# Patient Record
Sex: Female | Born: 1975 | Race: White | Hispanic: No | Marital: Married | Smoking: Never smoker
Health system: Southern US, Community
[De-identification: ages and names within clinical notes are randomized; demographics above are authoritative.]

## PROBLEM LIST (undated history)

## (undated) ENCOUNTER — Emergency Department (HOSPITAL_BASED_OUTPATIENT_CLINIC_OR_DEPARTMENT_OTHER): Payer: BLUE CROSS/BLUE SHIELD

## (undated) DIAGNOSIS — N3010 Interstitial cystitis (chronic) without hematuria: Secondary | ICD-10-CM

## (undated) HISTORY — DX: Interstitial cystitis (chronic) without hematuria: N30.10

## (undated) HISTORY — PX: AUGMENTATION MAMMAPLASTY: SUR837

---

## 1999-12-10 ENCOUNTER — Other Ambulatory Visit: Admission: RE | Admit: 1999-12-10 | Discharge: 1999-12-10 | Payer: Self-pay | Admitting: Obstetrics and Gynecology

## 2000-07-13 ENCOUNTER — Other Ambulatory Visit: Admission: RE | Admit: 2000-07-13 | Discharge: 2000-07-13 | Payer: Self-pay | Admitting: Obstetrics and Gynecology

## 2000-12-20 ENCOUNTER — Encounter: Payer: Self-pay | Admitting: Obstetrics & Gynecology

## 2000-12-20 ENCOUNTER — Inpatient Hospital Stay (HOSPITAL_COMMUNITY): Admission: AD | Admit: 2000-12-20 | Discharge: 2000-12-22 | Payer: Self-pay | Admitting: Obstetrics and Gynecology

## 2000-12-27 ENCOUNTER — Inpatient Hospital Stay (HOSPITAL_COMMUNITY): Admission: AD | Admit: 2000-12-27 | Discharge: 2001-01-16 | Payer: Self-pay | Admitting: Obstetrics and Gynecology

## 2000-12-27 ENCOUNTER — Encounter (INDEPENDENT_AMBULATORY_CARE_PROVIDER_SITE_OTHER): Payer: Self-pay | Admitting: Specialist

## 2001-01-03 ENCOUNTER — Encounter: Payer: Self-pay | Admitting: Obstetrics & Gynecology

## 2001-01-05 ENCOUNTER — Encounter: Payer: Self-pay | Admitting: Obstetrics and Gynecology

## 2001-01-09 ENCOUNTER — Encounter: Payer: Self-pay | Admitting: *Deleted

## 2001-01-11 ENCOUNTER — Encounter: Payer: Self-pay | Admitting: Obstetrics and Gynecology

## 2001-01-17 ENCOUNTER — Encounter: Admission: RE | Admit: 2001-01-17 | Discharge: 2001-02-16 | Payer: Self-pay | Admitting: Obstetrics and Gynecology

## 2001-02-10 ENCOUNTER — Other Ambulatory Visit: Admission: RE | Admit: 2001-02-10 | Discharge: 2001-02-10 | Payer: Self-pay | Admitting: Obstetrics and Gynecology

## 2001-02-19 ENCOUNTER — Ambulatory Visit (HOSPITAL_COMMUNITY): Admission: RE | Admit: 2001-02-19 | Discharge: 2001-02-19 | Payer: Self-pay | Admitting: Obstetrics and Gynecology

## 2001-02-19 ENCOUNTER — Encounter (INDEPENDENT_AMBULATORY_CARE_PROVIDER_SITE_OTHER): Payer: Self-pay

## 2001-04-20 ENCOUNTER — Encounter: Payer: Self-pay | Admitting: Obstetrics and Gynecology

## 2001-04-20 ENCOUNTER — Encounter: Admission: RE | Admit: 2001-04-20 | Discharge: 2001-04-20 | Payer: Self-pay | Admitting: Obstetrics and Gynecology

## 2001-08-08 ENCOUNTER — Encounter: Payer: Self-pay | Admitting: Obstetrics and Gynecology

## 2001-08-08 ENCOUNTER — Encounter: Admission: RE | Admit: 2001-08-08 | Discharge: 2001-08-08 | Payer: Self-pay | Admitting: Obstetrics and Gynecology

## 2002-11-08 ENCOUNTER — Other Ambulatory Visit: Admission: RE | Admit: 2002-11-08 | Discharge: 2002-11-08 | Payer: Self-pay | Admitting: Obstetrics and Gynecology

## 2003-06-04 ENCOUNTER — Inpatient Hospital Stay (HOSPITAL_COMMUNITY): Admission: AD | Admit: 2003-06-04 | Discharge: 2003-06-08 | Payer: Self-pay | Admitting: Obstetrics and Gynecology

## 2003-06-09 ENCOUNTER — Encounter: Admission: RE | Admit: 2003-06-09 | Discharge: 2003-07-09 | Payer: Self-pay | Admitting: Obstetrics and Gynecology

## 2003-07-18 ENCOUNTER — Other Ambulatory Visit: Admission: RE | Admit: 2003-07-18 | Discharge: 2003-07-18 | Payer: Self-pay | Admitting: Obstetrics and Gynecology

## 2004-07-07 ENCOUNTER — Encounter: Admission: RE | Admit: 2004-07-07 | Discharge: 2004-07-07 | Payer: Self-pay | Admitting: Obstetrics and Gynecology

## 2004-08-13 ENCOUNTER — Encounter: Admission: RE | Admit: 2004-08-13 | Discharge: 2004-08-13 | Payer: Self-pay | Admitting: Internal Medicine

## 2004-09-01 ENCOUNTER — Encounter: Admission: RE | Admit: 2004-09-01 | Discharge: 2004-09-01 | Payer: Self-pay | Admitting: Internal Medicine

## 2004-09-17 ENCOUNTER — Encounter: Admission: RE | Admit: 2004-09-17 | Discharge: 2004-09-17 | Payer: Self-pay | Admitting: Internal Medicine

## 2004-12-25 ENCOUNTER — Emergency Department (HOSPITAL_COMMUNITY): Admission: EM | Admit: 2004-12-25 | Discharge: 2004-12-25 | Payer: Self-pay | Admitting: Family Medicine

## 2005-12-19 ENCOUNTER — Emergency Department (HOSPITAL_COMMUNITY): Admission: EM | Admit: 2005-12-19 | Discharge: 2005-12-19 | Payer: Self-pay | Admitting: Emergency Medicine

## 2006-01-29 ENCOUNTER — Encounter: Admission: RE | Admit: 2006-01-29 | Discharge: 2006-01-29 | Payer: Self-pay | Admitting: Obstetrics and Gynecology

## 2006-02-11 IMAGING — NM NM HEPATO W/GB/PHARM/[PERSON_NAME]
2 series · 2 of 2 positions shown · non-contrast
Comparison: none

CLINICAL DATA: Abdominal pain. 
 NUCLEAR MEDICINE HEPATOBILIARY SCAN WITH EJECTION FRACTION:
TECHNIQUE: Sequential abdominal images were obtained following intravenous injection of radiopharmaceutical.  Sequential images were continued following oral ingestion of 8 oz. half-and-half, and the gallbladder ejection fraction was calculated.
 Radiopharmaceutical:  5.0 mCi 6c-TTm Choletec

[gb hepatobiliary · 1 of 1 slices shown (1 of 2)]
[im 1/1]
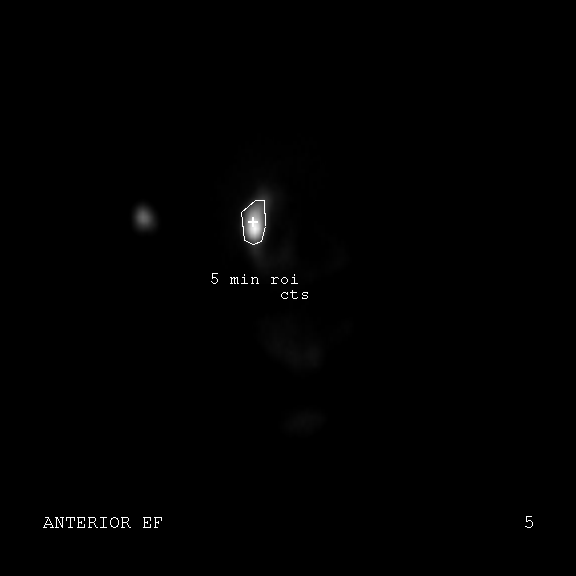

[gb hepatobiliary · 1 of 1 slices shown (2 of 2)]
[im 1/1]
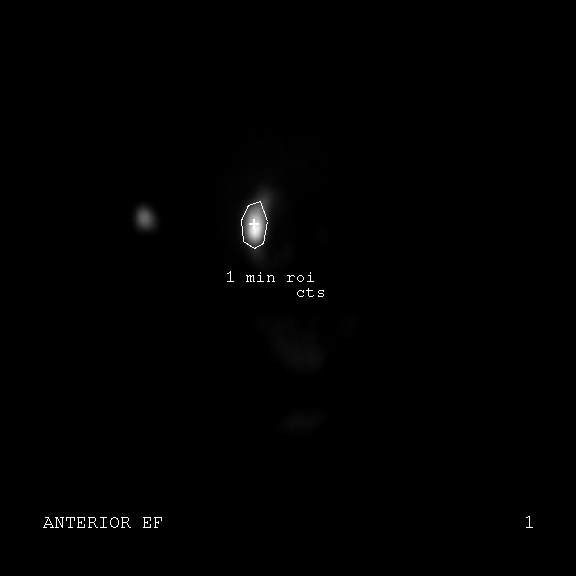

[2 of 2 positions shown; findings below may reference images not displayed]

FINDINGS: The initial images demonstrate homogeneous hepatic activity with prompt opacification of the biliary system and gallbladder.  There is spontaneous drainage into the small bowel. 
 Subsequent images obtained after the patient ingested oral half-and-half cream demonstrate good gallbladder contraction.  The gallbladder ejection fraction calculated at 45 minutes is 66 percent, well within normal limits.
IMPRESSION: Normal hepatobiliary scan and ejection fraction.

## 2006-02-27 IMAGING — CT CT PELVIS W/ CM
1 of 2 series · 15 of 32 positions shown, 19 images · IV contrast (BAROCAT & WATER & [ID] OMNI 300)
Comparison: None.

CLINICAL DATA: Persistent abdominal pain.
 ABDOMEN CT WITH CONTRAST:
TECHNIQUE: Multidetector CT imaging of the abdomen was performed following the standard protocol during bolus administration of intravenous contrast.
 Contrast:  100 cc of Omnipaque 300.

[Series 2: a&p w/ · axial · 0.59mm/px · z∈[-424,-50]mm · 15 of 81 slices shown, 19 images]
[im 4/81  soft-tissue]
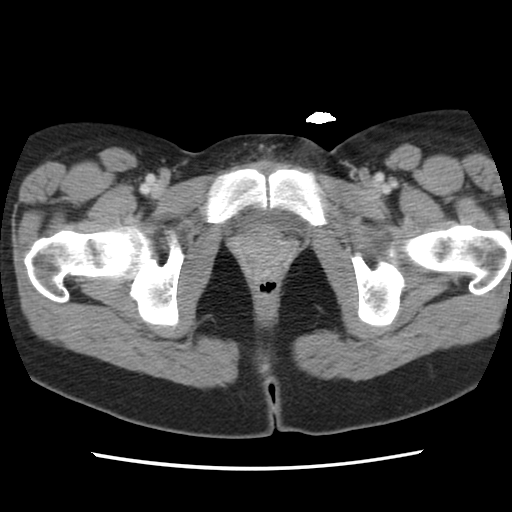
[im 4/81  bone]
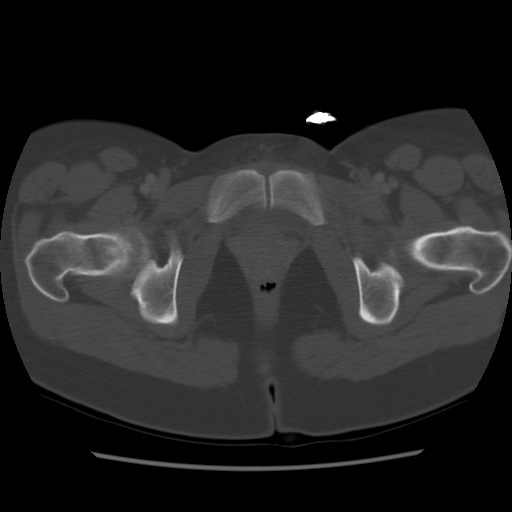
[im 11/81  soft-tissue]
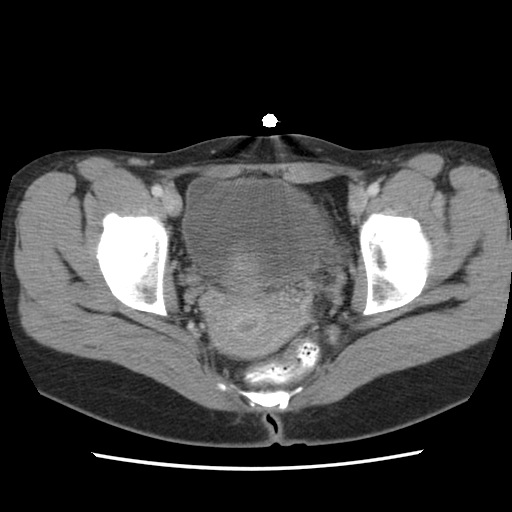
[im 18/81  soft-tissue]
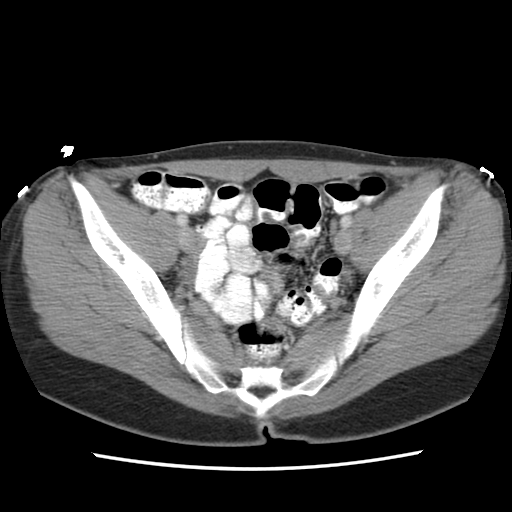
[im 21/81  soft-tissue]
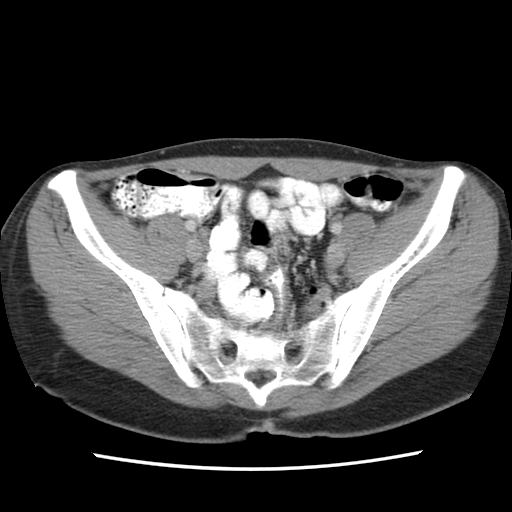
[im 28/81  soft-tissue]
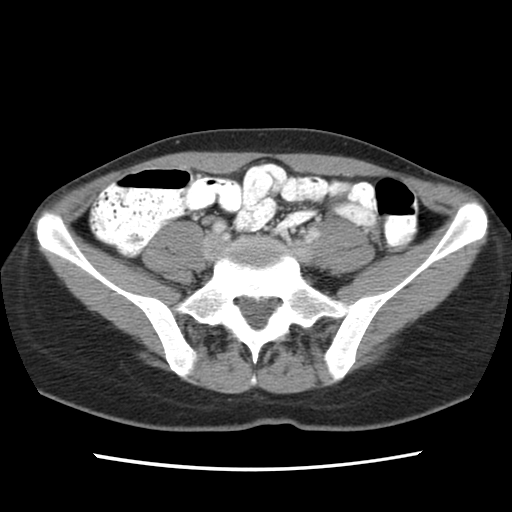
[im 35/81  soft-tissue]
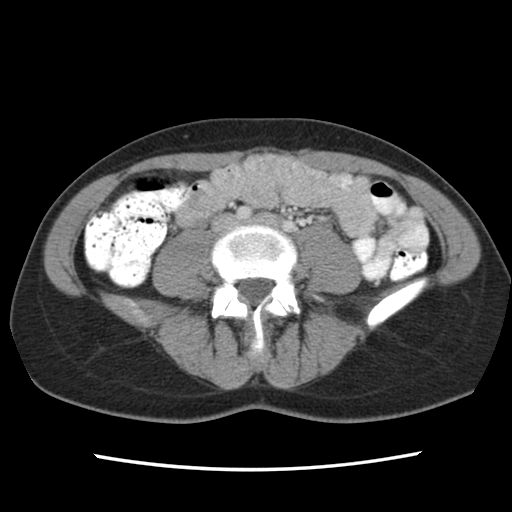
[im 42/81  soft-tissue]
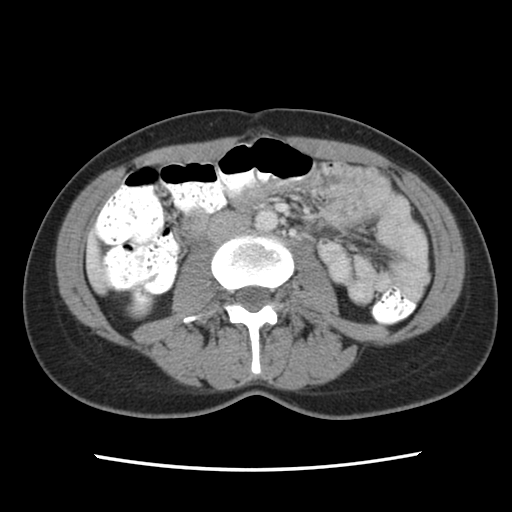
[im 46/81  soft-tissue]
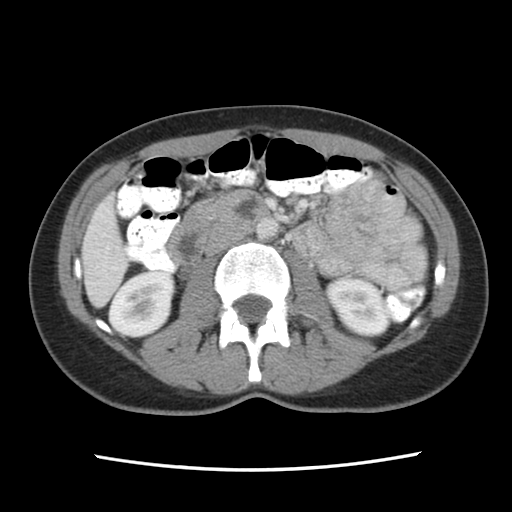
[im 53/81  soft-tissue]
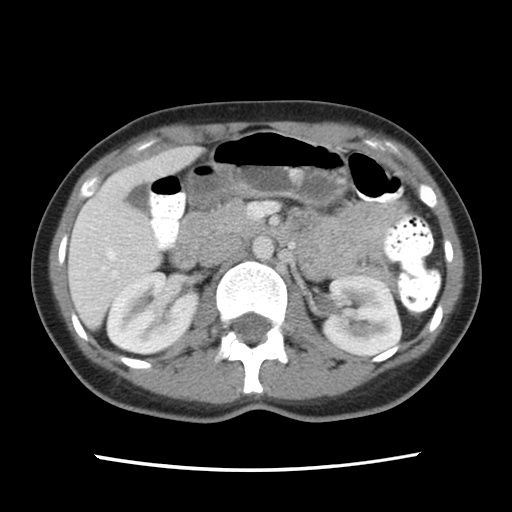
[im 53/81  bone]
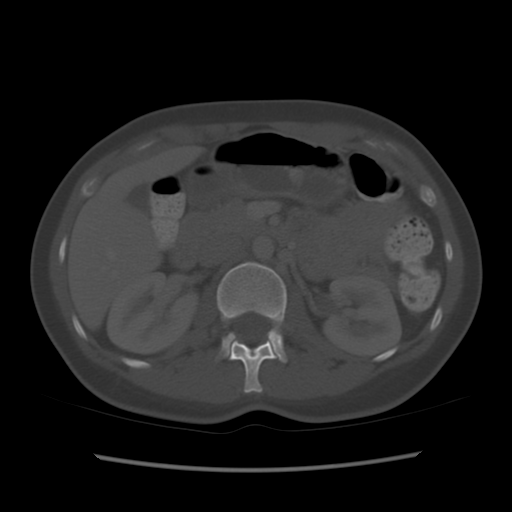
[im 60/81  soft-tissue]
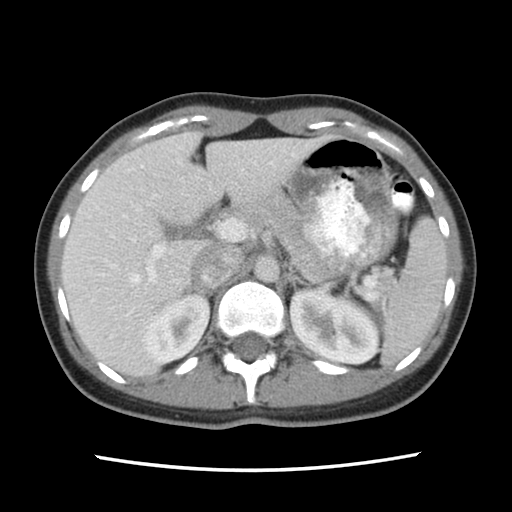
[im 63/81  soft-tissue]
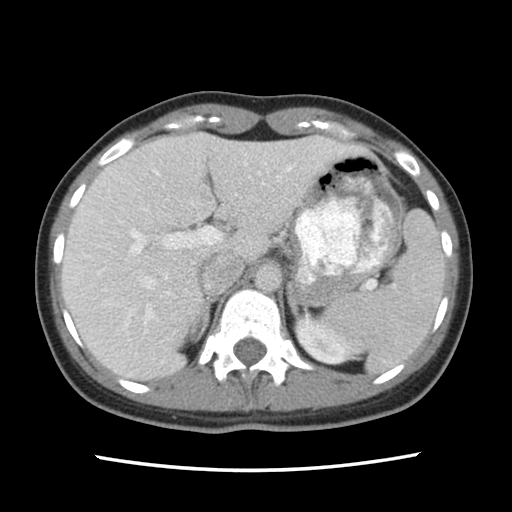
[im 67/81  lung]
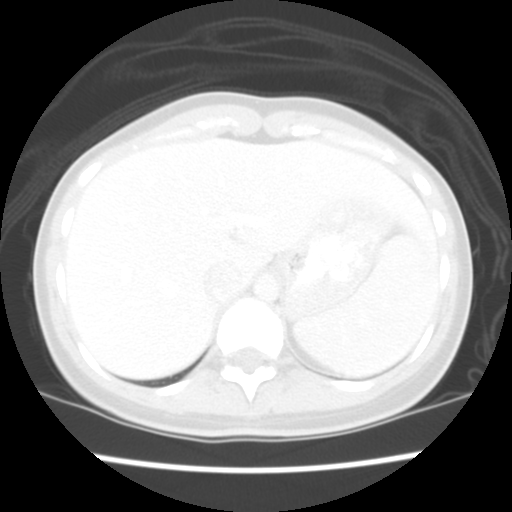
[im 70/81  soft-tissue]
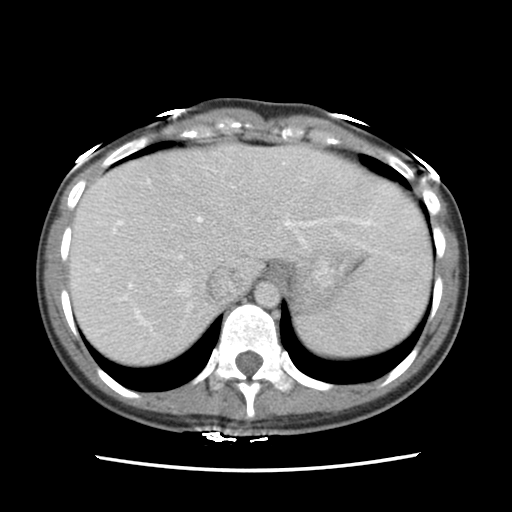
[im 70/81  lung]
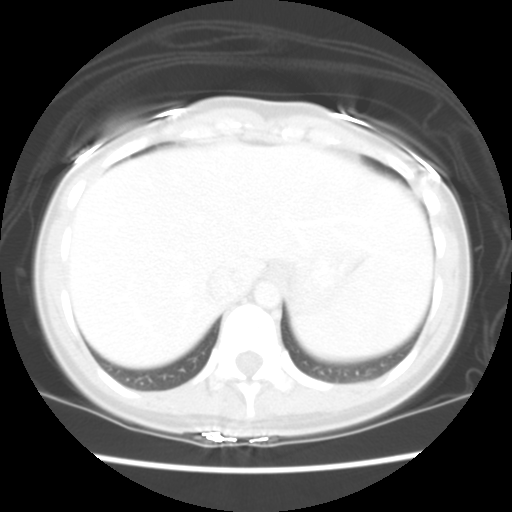
[im 74/81  lung]
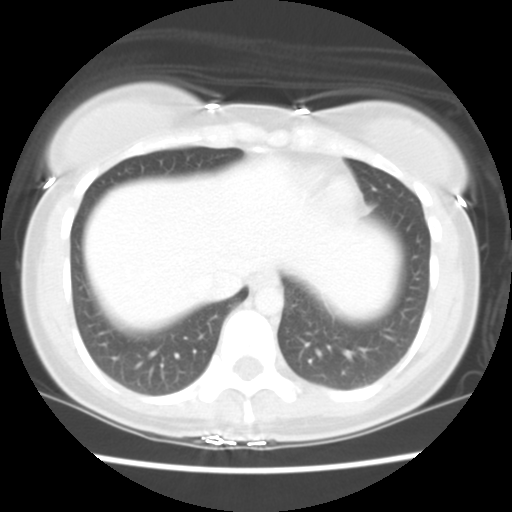
[im 77/81  soft-tissue]
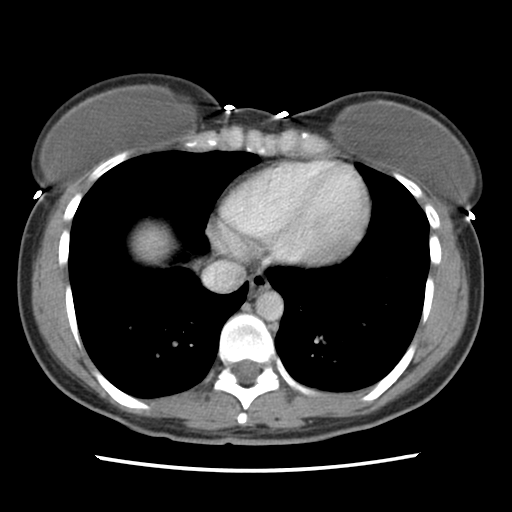
[im 77/81  lung]
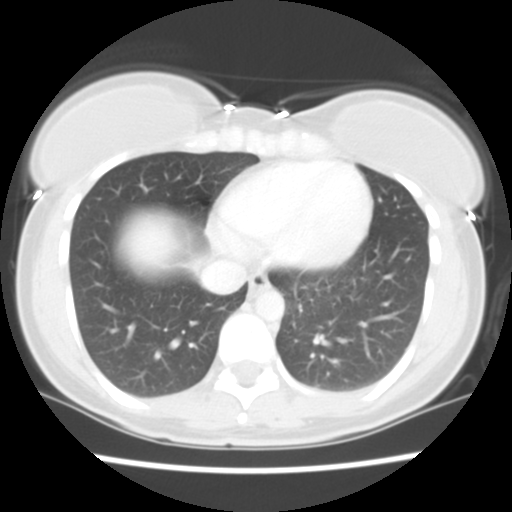

[15 of 32 positions shown; findings below may reference images not displayed]

FINDINGS: There are bilateral breast prostheses.
 The liver is normal in attenuation, morphology, and enhancement.  No intrahepatic biliary ductal dilatation.  
 The spleen is negative.
 The pancreas and adrenal glands are negative. 
 The kidneys are unremarkable.
 No free abdominal fluid.  No mesenteric or retroperitoneal pathologic lymphadenopathy.
 Review of bone windows demonstrate no lytic or sclerotic lesions.
IMPRESSION: Normal abdominal CT scan.
 PELVIC CT WITH CONTRAST:
 A small amount of fluid is identified within the uterine cavity.  This may be normal in a 28 year old female.  Additional, there is a trace amount of free pelvic fluid.  Please correlate these findings with the patient's menstrual history.  The adnexal structures are unremarkable.  The pelvic bowel loops are normal.  No pathologically enlarged pelvic lymphadenopathy.  
 No inguinal lymphadenopathy.
 Review of bone windows demonstrates no lytic or sclerotic lesions.
IMPRESSION: 1.  No acute pelvic   CT findings.
 2.  Fluid within uterine cavity which may be normal depending on patient's menstrual history.

## 2007-05-03 ENCOUNTER — Ambulatory Visit (HOSPITAL_COMMUNITY): Admission: RE | Admit: 2007-05-03 | Discharge: 2007-05-03 | Payer: Self-pay | Admitting: Obstetrics and Gynecology

## 2008-08-03 ENCOUNTER — Encounter: Admission: RE | Admit: 2008-08-03 | Discharge: 2008-08-03 | Payer: Self-pay | Admitting: Obstetrics and Gynecology

## 2009-12-11 ENCOUNTER — Encounter: Admission: RE | Admit: 2009-12-11 | Discharge: 2009-12-11 | Payer: Self-pay | Admitting: Obstetrics and Gynecology

## 2010-03-02 HISTORY — PX: OTHER SURGICAL HISTORY: SHX169

## 2010-03-10 ENCOUNTER — Ambulatory Visit
Admission: RE | Admit: 2010-03-10 | Discharge: 2010-03-10 | Payer: Self-pay | Source: Home / Self Care | Attending: Urology | Admitting: Urology

## 2010-03-17 LAB — POCT HEMOGLOBIN-HEMACUE: Hemoglobin: 15.4 g/dL — ABNORMAL HIGH (ref 12.0–15.0)

## 2010-03-17 LAB — POCT PREGNANCY, URINE: Preg Test, Ur: NEGATIVE

## 2010-04-02 NOTE — Op Note (Signed)
[name], [name]           ACCOUNT NO.:  [id number]  MEDICAL RECORD NO.:  [id number]          PATIENT TYPE:  AMB  LOCATION:  [facility]                         FACILITY:  [facility]  PHYSICIAN:  Bertram Millard. Dore Oquin, M.D.DATE OF BIRTH:  October 13, 1975  DATE OF PROCEDURE:  03/10/2010 DATE OF DISCHARGE:                              OPERATIVE REPORT   PREOPERATIVE DIAGNOSIS:  Interstitial cystitis, bladder pain.  POSTOPERATIVE DIAGNOSIS:  Interstitial cystitis, bladder pain.  PROCEDURE: 1. Cystoscopy. 2. HOD. 3. Instillation of Marcaine/Pyridium.  SURGEON:  Bertram Millard. Yuri Flener, M.D.  ANESTHESIA:  General with LMA.  COMPLICATIONS:  None.  BRIEF HISTORY:  This is a 35 year old female with recurrent bladder pain, frequency, urgency and intermittent hematuria.  The patient has had a thorough investigation in the office including renal ultrasound, CT abdomen and pelvis, and cystoscopy.  All these were essentiallynormal.  Due to the patient's persistent symptoms and possible diagnosis of interstitial cystitis, she presents at this time for cysto,  HOD, and possible bladder biopsy.  Risks and complications have been discussed with the patient and she desires to proceed.  DESCRIPTION OF PROCEDURE:  The patient was administered preoperative IV antibiotics and identified in the holding area and taken to the operating room where general anesthetic was administered using the LMA. She was placed in the dorsal lithotomy position.  Genitalia and perineum were prepped and draped.  Time-out was then called.  The procedure was then commenced.  A 22-French panendoscope was advanced into her bladder.  The bladder was inspected circumferentially.  There was mild squamous metaplasia in the trigonal area.  Both ureteral orifices were normal in their configuration and location.  There were no lesions within the bladder.  There were slight trabeculations throughout but no superficial lesions in the  bladder.  The vascular pattern appeared normal.  The bladder was then filled with irrigant which was placed 700 mm above the patient's bladder.  After free egress into the bladder, the irrigation stopped going in.  The bladder was left filled for 10 minutes.  When the bladder was full, there was a palpable suprapubic mass consistent with the bladder distention.  There was no leakage around the cystoscope.  Following 10 minutes, the bladder was drained. There was approximately 650 cc of irrigant left in the bladder.  The bladder was reinspected.  There were multiple scattered glomerulations throughout the bladder.  There was a mild degree of hematuria but no active lesions were seen.  Prior to distention of the bladder, the ureteral jets of urine were seen bilaterally without blood.  Following thorough inspection of the bladder, the bladder was drained and Marcaine/Pyridium solution was placed.  The patient received a B and O suppository during the procedure as well.  Bimanual exam after bladder drainage revealed no masses, good bladder support.  The patient also received Toradol.  She was awakened and taken to PACU in stable condition.  She will be discharged home on Bactrim DS 1 p.o. b.i.d. for 3 days, oxycodone for pain and Uribel for frequency and urgency.  She will follow up with me on March 31, 2010.     Bertram Millard. Sayvion Vigen, M.D.  SMD/MEDQ  D:  03/10/2010  T:  03/10/2010  Job:  [id number]  cc:   Lenoard Aden, M.D. Fax: [phone]  Electronically Signed by Marcine Matar M.D. on 04/02/2010 06:02:01 PM

## 2010-07-18 NOTE — Discharge Summary (Signed)
[facility]  Patient:    [name], [name] Visit Number: [id number] MRN: [id number]          Service Type: DSU Location: [facility] Attending Physician:  Maxie Better Dictated by:   Lenoard Aden, M.D. Admit Date:  02/19/2001 Discharge Date: 02/19/2001                             Discharge Summary  HOSPITAL COURSE:              The patient underwent hospitalization for rule out preeclampsia at 31-6/7 weeks.  Her hospital surveillance was stable during the time of her stay.  She received betamethasone on October 27, and October 28.  She had multiple labs drawn and had by definition mild preeclampsia at the time of her hospitalization.  24-hour urines were repetitively checked and noted to have worsening amounts of protein in the urine with otherwise stable prenatal labs.  Second urine showing 1036 mg in the 24-hour specimen.  At 34 weeks, the patient continued with stable lab work, however, her 24-hour urine revealed a marked increase in protein, up to 10,000 mg in a 24-hour specimen.  Amniocentesis revealed no evidence of lung maturity, however, due to worsening proteinuria with hyperreflexia and [redacted] weeks gestation, the decision was made to induce.  Cervidil was placed.  The patient labored and progressed to an uncomplicated delivery of a female over first degree perineal laceration, Apgars 8 and 9.  Postoperative course uncomplicated.  Blood pressures well controlled on magnesium over 24-hour postpartum course.  She was discharged to home on day #3.  She will be scheduled for a 24-hour urine postpartum.  Discharge teaching is done.  Preeclamptic warnings were given. Dictated by:   Lenoard Aden, M.D. Attending Physician:  Maxie Better DD:  03/11/01 TD:  03/12/01 Job: [id number]

## 2010-07-18 NOTE — Discharge Summary (Signed)
[facility]  Patient:    [name], [name] Visit Number: [id number] MRN: [id number]          Service Type: OBS Location: 910D 9128 01 Attending Physician:  Maxie Better Dictated by:   Marina Gravel, M.D. Admit Date:  12/27/2000 Disc. Date: 12/22/00                             Discharge Summary  ADMISSION DIAGNOSES:          1. Intrauterine pregnancy at 30 weeks.                               2. Generalized edema.                               3. Rule out preeclampsia.  HISTORY OF PRESENT ILLNESS:   For complete details, please the H&P in the chart.  Briefly, presented at 30 weeks this 35 year old white female, gravida 1, para 0, who was noted to have progressive facial and upper extremity edema over the last one week.  She had gained 9 pounds over the previous week.  She had had no previous problems with blood pressure.  The blood pressure was also noted to be normal in the office on the day of admission.  However, a CBC and pH levels were sent from the office and the CBC showed that the patient had mild thrombocytopenia at 117 and SGOT slightly elevated at 40.  She was therefore admitted for 24-hour evaluation and observation for potential preeclampsia.  HOSPITAL COURSE:              The patient was admitted to the antepartum service as outlined above.  A 24-hour urine was collected.  Labs were rechecked and were stable.  In fact, her platelets rose to 143 and liver function tests were within normal limits.  Ultrasound showed normal grown fetus (greater than 90th percentile).  The 24-hour urine at 232 mg/24 hours and blood pressures remained in the normal range.  She was therefore discharged to home on bed rest to continue preeclampsia precautions and repeat labs within the week after dismissal.  CONDITION ON DISCHARGE:       Improved.  DISCHARGE MEDICATIONS:        Prenatal vitamins.  DISCHARGE INSTRUCTIONS:       Standard  preprinted precautions were given regarding preeclampsia prior to dismissal.  FOLLOW-UP:                    [facility] with Sheronette A. Cherly Hensen, M.D., on December 27, 2000. Dictated by:   Marina Gravel, M.D. Attending Physician:  Maxie Better DD:  01/09/01 TD:  01/10/01 Job: [id number]

## 2010-07-18 NOTE — Op Note (Signed)
[facility]  Patient:    [name], [name] Visit Number: [id number] MRN: [id number]          Service Type: DSU Location: [facility] Attending Physician:  Maxie Better Dictated by:   Sheria Lang. Cherly Hensen, M.D. Proc. Date: 02/19/01 Admit Date:  02/19/2001 Discharge Date: 02/19/2001                             Operative Report  PREOPERATIVE DIAGNOSIS:       Retained products of conception status post vaginal delivery on November 19.  POSTOPERATIVE DIAGNOSIS:      Retained products of conception status post spontaneous vaginal delivery on January 18, 2001.  PROCEDURES:                   1. Diagnostic hysteroscopy.                               2. Removal of retained products of conception.                               3. Dilation and curettage.  SURGEON:                      Sheronette A. Cherly Hensen, M.D.  ANESTHESIA:                   General.  INDICATIONS:                  This is a 35 year old gravida 1, para 0-1-0-1 female who is status post vaginal delivery on January 18, 2001, who presented with two episodes of heavy vaginal bleeding, and who was found on ultrasound to have an endometrial mass suggestive of retained tissue, and who now presents for further management.  The risks and benefits of the procedure have been explained to the patient.  Consent was signed and the patient was transferred to the operating room.  DESCRIPTION OF PROCEDURE:     Under adequate general anesthesia, the patient was placed in the dorsal lithotomy position.  She was examined under anesthesia.  Her uterus was anteflexed and small.  No adnexal masses could be appreciated.  The patient was sterilely prepped and draped in the usual fashion.  The bladder was catheterized for a minimal amount of urine.  A bivalve speculum was placed in the vagina.  A single-tooth tenaculum was placed on the anterior lip of the cervix.  The cervix was then serially dilated up to  a #31 Pratt dilator.  A glycine-primed video resectoscope was introduced into the uterine cavity.  Both tubal ostia could be seen.  There were products of conception noted on the right lateral wall and in the fundal area.  Using the single loop, the products were removed.  The resectoscope was then removed.  The cavity was subsequently curetted.  A moderate amount of tissue was then noted.  The procedure was terminated when the cavity was felt to have been cleaned of debris.  The tenaculum had pulled off of the anterior lip of the cervix, and two interrupted sutures of 3-0 chromic suture were then placed, with good hemostasis.  All instruments were then removed from the vagina.  SPECIMEN:  Labeled products of conception sent to pathology.  ESTIMATED BLOOD LOSS:         50 cc.  COMPLICATIONS:                None.  DISPOSITION:                  The patient tolerated the procedure well and was transferred to the recovery room in stable condition. Dictated by:   Sheria Lang. Cherly Hensen, M.D. Attending Physician:  Maxie Better DD:  02/19/01 TD:  02/21/01 Job: [id number]

## 2010-07-18 NOTE — Op Note (Signed)
NAME:  [name], [name]                     ACCOUNT NO.:  [id number]   MEDICAL RECORD NO.:  [id number]                   PATIENT TYPE:  INP   LOCATION:  9171                                 FACILITY:  [facility]   PHYSICIAN:  Lenoard Aden, M.D.             DATE OF BIRTH:  1975-08-26   DATE OF PROCEDURE:  06/05/2003  DATE OF DISCHARGE:                                 OPERATIVE REPORT   INDICATIONS FOR OPERATIVE DELIVERY:  Fetal tachycardia, maternal exhaustion.   POSTOPERATIVE DIAGNOSIS:  Fetal tachycardia, maternal exhaustion, mild  shoulder dystocia.   PROCEDURE:  After explaining Kiwi cup, risks, benefits, risks of  cephalohematoma, small incidents of intracranial hemorrhage, and scalp  laceration, Kiwi cup was placed on the fetal vertex, +3 station OA after  emptying the bladder with a red rubber catheter.  At this time, three pulls  were initiated with the Kiwi cup over central median episiotomy with third  degree extension, Apgars 6 and 8, cord pH 7.31.  Repair of third degree  laceration with 3-0 Monocryl.  Small right vaginal wall laceration repaired  with interrupted suture of 3-0.  The cervix is without lacerations.  Estimated blood loss 600 mL.  Mild amount of uterine atony is encountered  and IV Pitocin in a dilute solution of 40 units is placed.  Please note that  there was some mild shoulder dystocia encountered during the delivery and a  tight nuchal cord which was reduced.  Mild shoulder dystocia was relieved  with suprapubic pressure.  Neonate and mother are recovering in good  condition.  Rectum is intact.                                               Lenoard Aden, M.D.    RJT/MEDQ  D:  06/06/2003  T:  06/06/2003  Job:  [id number]

## 2010-07-18 NOTE — Op Note (Signed)
[facility]  Patient:    [name], [name] Visit Number: [id number] MRN: [id number]          Service Type: OBS Location: 910B 9156 01 Attending Physician:  Maxie Better Dictated by:   Lenoard Aden, M.D. Proc. Date: 01/13/01 Admit Date:  12/27/2000                             Operative Report  INDICATIONS FOR DELIVERY:     Nonreassuring fetal heart rate tracing.  DESCRIPTION OF PROCEDURE:     Patient has a 34 1/7 week intrauterine pregnancy with late decelerations in pushing which are repetitive, baseline of 120 beats per minute down to 90-100 starting after contractions.  These are repetitive in nature.  Fetal vertex is +4 on the perineum LOA, less than 45 degrees. After apprising patient and husband of risks, benefits in proceeding with vacuum assisted vaginal delivery, the Mityvac mushroom cup was placed for two pulls over first degree perineal laceration of a premature female.  Apgars 8 and 9.  Pediatricians are in attendance.  Bulb suctioned at the perineum. Placenta is difficult with separation, is removed manually and intact.  Is inspected.  Uterus is then inspected manually as well.  No cervical or vaginal wall lacerations noted.  First degree perineal laceration repaired with a 3-0 Vicryl Rapide interrupted x 2.  Estimated blood loss about 800 cc.  Patient doing well.  Baby to newborn nursery. Dictated by:   Lenoard Aden, M.D. Attending Physician:  Maxie Better DD:  01/13/01 TD:  01/13/01 Job: [id number]

## 2010-07-18 NOTE — H&P (Signed)
NAME:  [name], [name]                     ACCOUNT NO.:  [id number]   MEDICAL RECORD NO.:  [id number]                   PATIENT TYPE:  INP   LOCATION:  9171                                 FACILITY:  [facility]   PHYSICIAN:  Lenoard Aden, M.D.             DATE OF BIRTH:  1975/11/29   DATE OF ADMISSION:  06/04/2003  DATE OF DISCHARGE:                                HISTORY & PHYSICAL   CHIEF COMPLAINT:  Polyhydramnios.   HISTORY OF PRESENT ILLNESS:  A 35 year old white female, G2, P73, with EDD of  June 13, 2003, at 38+ weeks' gestation, with polyhydramnios.   MEDICATIONS:  Prenatal vitamins.   ALLERGIES:  SEASON ALLERGIES and CODEINE allergy.   PAST OBSTETRICAL HISTORY:  Remarkable for severe third trimester  preeclampsia with vaginal delivery November 2002, retained placenta with D&E  postpartum.   No other medical or surgical hospitalizations.   FAMILY HISTORY:  Chronic hypertension and alcohol use.   PRENATAL LABORATORY DATA:  Blood type of O positive, Rh antibody negative.  Rubella immune.  Hepatitis, HIV negative.   PHYSICAL EXAMINATION:  GENERAL:  She is a well-developed, well-nourished  white female in no acute distress.  HEENT:  Normal.  CHEST:  Lungs clear.  CARDIAC:  Regular rate and rhythm.  ABDOMEN:  Soft, gravid, nontender.  Estimated fetal weight 7 pounds.  PELVIC:  Cervix is flared, 2.5 cm long, firm vertex, -1.  EXTREMITIES:  No cords.  NEUROLOGIC:  Nonfocal.   IMPRESSION:  1. A 38-week obstetrics patient.  2. History of severe preeclampsia.  3. Polyhydramnios.   PLAN:  Proceed with Cervidil cervical ripening and induction.                                              Lenoard Aden, M.D.   RJT/MEDQ  D:  06/04/2003  T:  06/05/2003  Job:  [id number]

## 2010-07-18 NOTE — H&P (Signed)
[facility]  Patient:    [name], [name] Visit Number: [id number] MRN: [id number]          Service Type: OBS Location: 910A 9146 01 Attending Physician:  Genia Del Dictated by:   Marina Gravel, M.D. Admit Date:  12/20/2000                           History and Physical  ADMISSION DIAGNOSES:          1. Intrauterine pregnancy at 30 weeks.                               2. Generalized edema.                               3. Rule out preeclampsia.  HISTORY OF PRESENT ILLNESS:   A 35 year old white female, gravida 1 para 0 at 30 weeks, seen in the office today for progressive facial and upper extremity edema over the last one week.  She has gained 9 pounds in the last week.  She has had no problems with headaches, visual disturbances, or upper abdominal pain.  Blood pressure in the office today was normal at 120/70.  The patient had been evaluated as an outpatient with CBC and PIH labs sent STAT, and was to start a 24-hour urine.  However, receipt of the CBC shows platelets low at 117 and SGOT slightly elevated at 40.  She is therefore admitted for a 24-hour urine, further evaluation of potential preeclampsia.  PAST MEDICAL HISTORY:         Asthma.  PAST SURGICAL HISTORY:        None.  FAMILY HISTORY:               Stroke, heart disease, hypertension, asthma, and diabetes.  SOCIAL HISTORY:               No alcohol, tobacco, or other drugs.  MEDICATIONS:                  Prenatal vitamins.  ALLERGIES:                    CODEINE causes dizziness.  REVIEW OF SYSTEMS:            Otherwise normal.  Patient reports active fetus, no contractions, leakage of fluid, or vaginal bleeding.  PHYSICAL EXAMINATION:  VITAL SIGNS:                  Blood pressure 120/70, weight 144, fetal heart tones 128.  GENERAL:                      The patient is alert and oriented, no acute distress.  Mild facial edema noted.  HEART:                         Regular rate and rhythm.  LUNGS:                        Clear to auscultation.  ABDOMEN:                      Liver and spleen normal.  No hernia.  Fundal height 30 cm.  EXTREMITIES:  Lower extremities shows trace edema, normal reflexes.  LABORATORY DATA:              As outlined above.  ASSESSMENT:                   Pregnancy at 30 weeks with progressive generalized edema.  Labs slightly abnormal consistent with possible early HELLP syndrome.  PLAN:                         Admit to antepartum.  NST b.i.d..  Recheck labs tonight and again in the morning.  Betamethasone x 2 doses 12 hours apart to accelerate fetal lung maturity.  Discussed with the patient potential need for early delivery if her clinical situation deteriorates.  Will also obtain ultrasound to check amniotic fluid index and fetal growth.  On-call physician notified of patients admission. Dictated by:   Marina Gravel, M.D. Attending Physician:  Genia Del DD:  12/20/00 TD:  12/20/00 Job: [id number]

## 2010-07-30 ENCOUNTER — Other Ambulatory Visit: Payer: Self-pay | Admitting: Urology

## 2010-07-31 ENCOUNTER — Other Ambulatory Visit: Payer: Self-pay

## 2011-11-30 ENCOUNTER — Other Ambulatory Visit: Payer: Self-pay | Admitting: Urology

## 2011-11-30 DIAGNOSIS — N210 Calculus in bladder: Secondary | ICD-10-CM

## 2011-12-01 ENCOUNTER — Other Ambulatory Visit: Payer: Self-pay

## 2016-04-30 ENCOUNTER — Telehealth: Payer: Self-pay | Admitting: Cardiology

## 2016-04-30 NOTE — Telephone Encounter (Signed)
04/30/2016 [facility] with Corine ShelterLuke Kilroy, PA-C.  [facility]  cbr

## 2016-05-04 ENCOUNTER — Encounter: Payer: Self-pay | Admitting: Cardiology

## 2016-05-04 ENCOUNTER — Ambulatory Visit (INDEPENDENT_AMBULATORY_CARE_PROVIDER_SITE_OTHER): Payer: BLUE CROSS/BLUE SHIELD | Admitting: Cardiology

## 2016-05-04 VITALS — BP 100/70 | HR 67 | Ht 67.0 in | Wt 127.8 lb

## 2016-05-04 DIAGNOSIS — R079 Chest pain, unspecified: Secondary | ICD-10-CM | POA: Diagnosis not present

## 2016-05-04 DIAGNOSIS — Z87898 Personal history of other specified conditions: Secondary | ICD-10-CM | POA: Diagnosis not present

## 2016-05-04 DIAGNOSIS — Z87440 Personal history of urinary (tract) infections: Secondary | ICD-10-CM | POA: Insufficient documentation

## 2016-05-04 DIAGNOSIS — R0600 Dyspnea, unspecified: Secondary | ICD-10-CM | POA: Insufficient documentation

## 2016-05-04 DIAGNOSIS — R0609 Other forms of dyspnea: Secondary | ICD-10-CM | POA: Diagnosis not present

## 2016-05-04 DIAGNOSIS — R0789 Other chest pain: Secondary | ICD-10-CM | POA: Diagnosis not present

## 2016-05-04 NOTE — Patient Instructions (Addendum)
Medication Instructions:  Your physician recommends that you continue on your current medications as directed. Please refer to the Current Medication list given to you today.  Labwork: none  Testing/Procedures: Your physician has requested that you have an echocardiogram. Echocardiography is a painless test that uses sound waves to create images of your heart. It provides your doctor with information about the size and shape of your heart and how well your heart's chambers and valves are working. This procedure takes approximately one hour. There are no restrictions for this procedure. [facility] [address]  Your physician has requested that you have an exercise tolerance test. For further information please visit [email or web address]. Please also follow instruction sheet, as given.  Follow-Up: Your physician recommends that you schedule a follow-up appointment in: 4 WEEKS WITH DR Jens SomRENSHAW   If you need a refill on your cardiac medications before your next appointment, please call your pharmacy.

## 2016-05-04 NOTE — Assessment & Plan Note (Signed)
Some typical, some atypical features 

## 2016-05-04 NOTE — Progress Notes (Signed)
05/04/2016 Katherine JewelsDona Owens   September 13, 1975  [id number]  Primary Physician No primary care provider on file. Primary Cardiologist: Dr Jens Somrenshaw (new)  HPI:  41 y/o female, works as a Scientist, research (medical)hairstylist, seen in the office today as a referral from her OBGYN Dr [name], with a history of palpitations, DOE, and chest pain. She has a history of recurrent UTIs and was treated 6 months ago with Cipro. Since then she noted occasional localized Lt chest pain as well as intermittent "band like" chest discomfort. She also feels like she is more SOB doing her usual activity than she should be and has had occasional dizziness where she had to sit down. She has also noted increasing palpitations "fluttering" in her chest but no sustained tachycardia.    Current Outpatient Prescriptions  Medication Sig Dispense Refill  . cetirizine (ZYRTEC) 10 MG tablet Take 10 mg by mouth daily as needed for allergies.    . pentosan polysulfate (ELMIRON) 100 MG capsule Take 100 mg by mouth daily.     No current facility-administered medications for this visit.     Allergies  Allergen Reactions  . Codeine Nausea Only and Other (See Comments)    Other Reaction: DIZZY  . Sulfamethoxazole-Trimethoprim Other (See Comments)    Other Reaction: MIGRAINS    Past Medical History:  Diagnosis Date  . Interstitial cystitis     Social History   Social History  . Marital status: Married    Spouse name: N/A  . Number of children: N/A  . Years of education: N/A   Occupational History  . Not on file.   Social History Main Topics  . Smoking status: Never Smoker  . Smokeless tobacco: Never Used  . Alcohol use No  . Drug use: No  . Sexual activity: Yes   Other Topics Concern  . Not on file   Social History Narrative  . No narrative on file     Family History  Problem Relation Age of Onset  . Hyperlipidemia Mother   . Heart failure Father     heavy drinker and smoker  . Diabetes Brother   . Hypertension Maternal  Grandmother   . Throat cancer Maternal Grandfather   . Heart murmur Brother      Review of Systems: General: negative for chills, fever, night sweats or weight changes.  Cardiovascular: negative for edema, orthopnea, paroxysmal nocturnal dyspnea Dermatological: negative for rash Respiratory: negative for cough or wheezing Urologic: negative for hematuria-h/o recurrent UTIs Abdominal: negative for nausea, vomiting, diarrhea, bright red blood per rectum, melena, or hematemesis Neurologic: negative for visual changes, syncope, or dizziness All other systems reviewed and are otherwise negative except as noted above.    Blood pressure 100/70, pulse 67, height 5\' 7"  (1.702 m), weight 127 lb 12.8 oz (58 kg).  General appearance: alert, cooperative and no distress Neck: no carotid bruit and no JVD Lungs: clear to auscultation bilaterally Heart: regular rate and rhythm Abdomen: soft, non-tender; bowel sounds normal; no masses,  no organomegaly Extremities: extremities normal, atraumatic, no cyanosis or edema Pulses: 2+ and symmetric Skin: Skin color, texture, turgor normal. No rashes or lesions Neurologic: Grossly normal  EKG NSR  ASSESSMENT AND PLAN:   Chest pain of uncertain etiology Some typical, some atypical features  History of palpitations Heart "flips"- no sustained tachycardia  Dyspnea on exertion Noted in the last 3 -6 months  History of recurrent UTIs .   PLAN  I suggested we proceed with a POET and echo. I cautioned  her about caffeine use and OTC decongestants. F/U with Dr Jens Som after the above tests.   Corine Shelter PA-C 05/04/2016 3:01 PM

## 2016-05-04 NOTE — Assessment & Plan Note (Signed)
Heart "flips"- no sustained tachycardia

## 2016-05-04 NOTE — Assessment & Plan Note (Signed)
Noted in the last 3 -6 months

## 2016-05-15 ENCOUNTER — Telehealth: Payer: Self-pay | Admitting: Cardiology

## 2016-05-15 NOTE — Telephone Encounter (Signed)
Called patient to schedule echo and ETT, the patient advised she did not want to schedule at this time due to cost and the fact that she is feeling better

## 2022-06-08 ENCOUNTER — Other Ambulatory Visit (HOSPITAL_BASED_OUTPATIENT_CLINIC_OR_DEPARTMENT_OTHER): Payer: Self-pay | Admitting: Adult Health

## 2022-06-08 ENCOUNTER — Ambulatory Visit (HOSPITAL_BASED_OUTPATIENT_CLINIC_OR_DEPARTMENT_OTHER)
Admission: RE | Admit: 2022-06-08 | Discharge: 2022-06-08 | Disposition: A | Discharge status: Home / Self Care | Payer: 59 | Source: Ambulatory Visit | Attending: Adult Health | Admitting: Adult Health

## 2022-06-08 DIAGNOSIS — N3010 Interstitial cystitis (chronic) without hematuria: Secondary | ICD-10-CM | POA: Diagnosis present

## 2022-06-08 DIAGNOSIS — Z87442 Personal history of urinary calculi: Secondary | ICD-10-CM

## 2022-06-08 DIAGNOSIS — R1033 Periumbilical pain: Secondary | ICD-10-CM | POA: Insufficient documentation

## 2022-06-08 MED ORDER — IOHEXOL 300 MG/ML  SOLN
100.0000 mL | Freq: Once | INTRAMUSCULAR | Status: AC | PRN
  Administered 2022-06-08: 100 mL via INTRAVENOUS

## 2022-06-09 ENCOUNTER — Ambulatory Visit (HOSPITAL_COMMUNITY)
Admission: RE | Admit: 2022-06-09 | Discharge: 2022-06-09 | Disposition: A | Discharge status: Home / Self Care | Payer: 59 | Source: Ambulatory Visit | Attending: Internal Medicine | Admitting: Internal Medicine

## 2022-06-09 ENCOUNTER — Other Ambulatory Visit (HOSPITAL_COMMUNITY): Payer: Self-pay | Admitting: Internal Medicine

## 2022-06-09 DIAGNOSIS — N949 Unspecified condition associated with female genital organs and menstrual cycle: Secondary | ICD-10-CM

## 2022-11-23 ENCOUNTER — Encounter: Payer: Self-pay | Admitting: Obstetrics & Gynecology

## 2024-01-18 ENCOUNTER — Other Ambulatory Visit: Payer: Self-pay | Admitting: Family

## 2024-01-18 DIAGNOSIS — N6325 Unspecified lump in the left breast, overlapping quadrants: Secondary | ICD-10-CM

## 2024-02-07 ENCOUNTER — Ambulatory Visit
Admission: RE | Admit: 2024-02-07 | Discharge: 2024-02-07 | Disposition: A | Source: Ambulatory Visit | Attending: Family | Admitting: Family

## 2024-02-07 ENCOUNTER — Other Ambulatory Visit: Payer: Self-pay | Admitting: Family

## 2024-02-07 DIAGNOSIS — R928 Other abnormal and inconclusive findings on diagnostic imaging of breast: Secondary | ICD-10-CM

## 2024-02-07 DIAGNOSIS — N6325 Unspecified lump in the left breast, overlapping quadrants: Secondary | ICD-10-CM
